# Patient Record
Sex: Male | Born: 1970 | Race: White | Hispanic: No | Marital: Married | Smoking: Never smoker
Health system: Southern US, Community
[De-identification: ages and names within clinical notes are randomized; demographics above are authoritative.]

## PROBLEM LIST (undated history)

## (undated) DIAGNOSIS — N529 Male erectile dysfunction, unspecified: Secondary | ICD-10-CM

## (undated) DIAGNOSIS — E785 Hyperlipidemia, unspecified: Secondary | ICD-10-CM

## (undated) DIAGNOSIS — Z8042 Family history of malignant neoplasm of prostate: Secondary | ICD-10-CM

## (undated) HISTORY — DX: Family history of malignant neoplasm of prostate: Z80.42

## (undated) HISTORY — DX: Hyperlipidemia, unspecified: E78.5

## (undated) HISTORY — DX: Male erectile dysfunction, unspecified: N52.9

---

## 2009-07-31 ENCOUNTER — Ambulatory Visit (HOSPITAL_BASED_OUTPATIENT_CLINIC_OR_DEPARTMENT_OTHER): Admission: RE | Admit: 2009-07-31 | Discharge: 2009-07-31 | Payer: Self-pay | Admitting: Ophthalmology

## 2011-03-28 LAB — POCT HEMOGLOBIN-HEMACUE: Hemoglobin: 16 g/dL (ref 13.0–17.0)

## 2011-05-05 NOTE — Op Note (Signed)
NAME:  Chris Garza, TEEPLE NO.:  [id number]   MEDICAL RECORD NO.:  [id number]          PATIENT TYPE:  AMB   LOCATION:  [facility]                         FACILITY:  [facility]   PHYSICIAN:  Tyrone Apple. 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   This procedure is indicated to restore single binocular vision and  restore alignment of visual axis.  The risks and benefits of the  procedure explained to the patient prior to procedure and informed  consent was obtained.   PROCEDURE:  Right inferior oblique myectomy and a right superior oblique  exploration.   DESCRIPTION OF TECHNIQUE:  The patient was taken into the operating room  and placed in the supine position.  The entire face was prepped and  draped in the usual sterile fashion.  After induction by general  anesthesia and establishment of laryngeal mask airway, my attention was  first directed to the right eye.  A lid speculum was placed.  The globe  was held in superior temporal quadrant.  The eye was depressed and  adducted.  An incision was made through the superior temporal fornix and  taken down to the posterior Tenons space and the right superior rectus  was then isolated on a Stevens hook and subsequently on a Green hook and  the superior oblique tendon was explored.  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  The tension in the superior oblique  tendon was found be adequate.  At this point attention was then directed  to the inferior oblique and the globe was then held in the inferior  temporal quadrant.  The eye was then elevated and abducted.  An incision  was made in the inferior temporal fornix and taken down to the posterior  subTenons space  and the right lateral rectus was then isolated on a  Stevens hooks  then subsequently a Green hook.  This was then used to  hold the globe in an elevated and abducted position and the inferior  oblique was then isolated under direct visualization coursing from its  origin in the anterior floor of the orbit to its insertion at the  posterior inferior temporal quadrant of the globe.  It was then  carefully sequestered on two Stevens hooks and it was then dissected  free from its overlying muscular fascia and intermuscular septum for a  distance of approximately 8 mm.  It was then placed on two Green hooks  and a 10 mm myectomy was then performed.  Hemostasis was achieved with  thermal cautery.  The instruments were removed.  The conjunctiva was  repositioned.  At the conclusion of the procedure TobraDex ointment was  instilled within the fornices of the right eye.  There were no apparent  complications.      Casimiro Needle A. Karleen Hampshire, M.D.  Electronically Signed     MAS/MEDQ  D:  07/31/2009  T:  07/31/2009  Job:  [id number]

## 2015-08-28 ENCOUNTER — Ambulatory Visit: Payer: Self-pay | Admitting: Family Medicine

## 2015-09-11 ENCOUNTER — Ambulatory Visit (INDEPENDENT_AMBULATORY_CARE_PROVIDER_SITE_OTHER): Payer: BLUE CROSS/BLUE SHIELD | Admitting: Family Medicine

## 2015-09-11 ENCOUNTER — Encounter: Payer: Self-pay | Admitting: Family Medicine

## 2015-09-11 VITALS — BP 147/94 | HR 86 | Ht 73.0 in | Wt 220.0 lb

## 2015-09-11 DIAGNOSIS — Z23 Encounter for immunization: Secondary | ICD-10-CM

## 2015-09-11 DIAGNOSIS — L819 Disorder of pigmentation, unspecified: Secondary | ICD-10-CM | POA: Insufficient documentation

## 2015-09-11 DIAGNOSIS — Z0000 Encounter for general adult medical examination without abnormal findings: Secondary | ICD-10-CM

## 2015-09-11 DIAGNOSIS — L570 Actinic keratosis: Secondary | ICD-10-CM

## 2015-09-11 DIAGNOSIS — N529 Male erectile dysfunction, unspecified: Secondary | ICD-10-CM | POA: Diagnosis not present

## 2015-09-11 DIAGNOSIS — Z202 Contact with and (suspected) exposure to infections with a predominantly sexual mode of transmission: Secondary | ICD-10-CM

## 2015-09-11 DIAGNOSIS — Z8042 Family history of malignant neoplasm of prostate: Secondary | ICD-10-CM | POA: Diagnosis not present

## 2015-09-11 HISTORY — DX: Family history of malignant neoplasm of prostate: Z80.42

## 2015-09-11 HISTORY — DX: Male erectile dysfunction, unspecified: N52.9

## 2015-09-11 MED ORDER — SILDENAFIL CITRATE 100 MG PO TABS: 50.0000 mg | ORAL_TABLET | Freq: Every day | ORAL | Status: DC | PRN
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
## 2015-09-12 ENCOUNTER — Telehealth: Payer: Self-pay | Admitting: *Deleted

## 2015-09-12 LAB — HEPATITIS C ANTIBODY: HCV Ab: NEGATIVE

## 2015-09-12 LAB — HSV 2 ANTIBODY, IGG

## 2015-09-12 LAB — RPR

## 2015-09-12 LAB — HIV ANTIBODY (ROUTINE TESTING W REFLEX): HIV: NONREACTIVE

## 2015-09-12 LAB — GC/CHLAMYDIA PROBE AMP, URINE
CHLAMYDIA, SWAB/URINE, PCR: NEGATIVE
GC PROBE AMP, URINE: NEGATIVE

## 2015-09-12 LAB — HSV 1 ANTIBODY, IGG: HSV 1 GLYCOPROTEIN G AB, IGG: 9.22 IV — AB

## 2015-09-12 MED ORDER — SILDENAFIL CITRATE 100 MG PO TABS: 50.0000 mg | ORAL_TABLET | Freq: Every day | ORAL | Status: DC | PRN

## 2015-09-12 NOTE — Telephone Encounter (Signed)
Pt called and states the Viagra was too expensive

## 2015-09-12 NOTE — Telephone Encounter (Signed)
Pt called back; wants generic viagra sent to [facility]

## 2015-09-13 ENCOUNTER — Telehealth: Payer: Self-pay | Admitting: Family Medicine

## 2015-09-13 DIAGNOSIS — B009 Herpesviral infection, unspecified: Secondary | ICD-10-CM | POA: Insufficient documentation

## 2015-09-13 DIAGNOSIS — Z1159 Encounter for screening for other viral diseases: Principal | ICD-10-CM

## 2015-09-13 MED ORDER — VALACYCLOVIR HCL 1 G PO TABS: ORAL_TABLET | ORAL | Status: DC

## 2015-09-13 MED ORDER — SILDENAFIL CITRATE 20 MG PO TABS: ORAL_TABLET | ORAL | Status: DC

## 2015-09-13 NOTE — Telephone Encounter (Signed)
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  There is no way to know when it happened but sometime in his life he was exposed to this virus and the virus is now hiding in his body.  The only way to transmit this virus to others would be direct contact with another body when he has an active ulcer.  I've put a Rx for valtrex in your in box, he can use this to treat an outbreak if he ever develops an ulcer on his lips or genitals.

## 2015-09-13 NOTE — Telephone Encounter (Signed)
Left message for pt to call back  °

## 2015-09-13 NOTE — Telephone Encounter (Signed)
Pt.notified

## 2015-09-13 NOTE — Telephone Encounter (Signed)
Chris Garza, Generic viagra in your in box.  I'd recommend he submit this to [facility] in [place] to avoid being blocked by his insurance.

## 2015-09-13 NOTE — Telephone Encounter (Signed)
Sent to [facility]

## 2015-09-13 NOTE — Addendum Note (Signed)
Addended by: Laren Boom on: 09/13/2015 08:02 AM   Modules accepted: Orders, Medications

## 2015-09-18 ENCOUNTER — Encounter: Payer: Self-pay | Admitting: Family Medicine

## 2015-09-18 ENCOUNTER — Telehealth: Payer: Self-pay | Admitting: Family Medicine

## 2015-09-18 DIAGNOSIS — E785 Hyperlipidemia, unspecified: Secondary | ICD-10-CM | POA: Insufficient documentation

## 2015-09-18 LAB — CBC
HCT: 43.9 % (ref 39.0–52.0)
HEMOGLOBIN: 15.5 g/dL (ref 13.0–17.0)
MCH: 31.1 pg (ref 26.0–34.0)
MCHC: 35.3 g/dL (ref 30.0–36.0)
MCV: 88 fL (ref 78.0–100.0)
MPV: 10.9 fL (ref 8.6–12.4)
Platelets: 277 10*3/uL (ref 150–400)
RBC: 4.99 MIL/uL (ref 4.22–5.81)
RDW: 14 % (ref 11.5–15.5)
WBC: 6 10*3/uL (ref 4.0–10.5)

## 2015-09-18 LAB — LIPID PANEL
CHOLESTEROL: 214 mg/dL — AB (ref 125–200)
HDL: 36 mg/dL — ABNORMAL LOW (ref >=40)
LDL Cholesterol: 146 mg/dL — ABNORMAL HIGH (ref <130)
TRIGLYCERIDES: 162 mg/dL — AB (ref <150)
Total CHOL/HDL Ratio: 5.9 Ratio — ABNORMAL HIGH (ref <=5.0)
VLDL: 32 mg/dL — AB (ref <30)

## 2015-09-18 LAB — COMPLETE METABOLIC PANEL WITH GFR
ALBUMIN: 4 g/dL (ref 3.6–5.1)
ALK PHOS: 81 U/L (ref 40–115)
ALT: 27 U/L (ref 9–46)
AST: 23 U/L (ref 10–40)
BILIRUBIN TOTAL: 0.9 mg/dL (ref 0.2–1.2)
BUN: 5 mg/dL — AB (ref 7–25)
CALCIUM: 8.8 mg/dL (ref 8.6–10.3)
CO2: 29 mmol/L (ref 20–31)
CREATININE: 1 mg/dL (ref 0.60–1.35)
Chloride: 105 mmol/L (ref 98–110)
GFR, Est African American: >89 mL/min (ref >=60)
GFR, Est Non African American: >89 mL/min (ref >=60)
GLUCOSE: 88 mg/dL (ref 65–99)
Potassium: 3.7 mmol/L (ref 3.5–5.3)
SODIUM: 142 mmol/L (ref 135–146)
TOTAL PROTEIN: 6.6 g/dL (ref 6.1–8.1)

## 2015-09-18 LAB — PSA: PSA: 1.95 ng/mL (ref <=4.00)

## 2015-09-18 MED ORDER — FISH OIL 1000 MG PO CAPS: ORAL_CAPSULE | ORAL | Status: DC

## 2015-09-18 NOTE — Telephone Encounter (Signed)
Sue Lush, Will you please let patient know that his LDL cholesterol was moderately elevated however given his age, blood pressure, and the fact that he does not have diabetes no prescription strength medication is warranted.  I would recommend starting a twice a day OTC fish oil capsule with meals to help this and his elevated triglyceride level.  Kidney function, liver function, blood sugar, blood cell counts and the PSA prostate test were all normal.  I'd recommend he have these rechecked annually.

## 2015-09-18 NOTE — Telephone Encounter (Signed)
Pt.notified

## 2015-10-01 ENCOUNTER — Encounter: Payer: Self-pay | Admitting: Family Medicine

## 2015-10-01 ENCOUNTER — Ambulatory Visit (INDEPENDENT_AMBULATORY_CARE_PROVIDER_SITE_OTHER): Payer: BLUE CROSS/BLUE SHIELD | Admitting: Family Medicine

## 2015-10-01 ENCOUNTER — Ambulatory Visit (INDEPENDENT_AMBULATORY_CARE_PROVIDER_SITE_OTHER): Payer: BLUE CROSS/BLUE SHIELD

## 2015-10-01 VITALS — BP 136/89 | HR 112 | Temp 99.8°F | Wt 215.0 lb

## 2015-10-01 DIAGNOSIS — R69 Illness, unspecified: Secondary | ICD-10-CM

## 2015-10-01 DIAGNOSIS — J111 Influenza due to unidentified influenza virus with other respiratory manifestations: Secondary | ICD-10-CM | POA: Diagnosis not present

## 2015-10-01 DIAGNOSIS — J189 Pneumonia, unspecified organism: Secondary | ICD-10-CM | POA: Diagnosis not present

## 2015-10-01 MED ORDER — AZITHROMYCIN 250 MG PO TABS: 250.0000 mg | ORAL_TABLET | Freq: Every day | ORAL | Status: DC

## 2015-10-01 MED ORDER — CEFDINIR 300 MG PO CAPS: 300.0000 mg | ORAL_CAPSULE | Freq: Two times a day (BID) | ORAL | Status: DC

## 2015-10-01 NOTE — Patient Instructions (Signed)
Thank you for coming in today. Return if worse or not better.  Get a xray today.  Take up to 2 aleve twice daily for pain and fever.  Call or go to the emergency room if you get worse, have trouble breathing, have chest pains, or palpitations.    Influenza, Adult Influenza ("the flu") is a viral infection of the respiratory tract. It occurs more often in winter months because people spend more time in close contact with one another. Influenza can make you feel very sick. Influenza easily spreads from person to person (contagious). CAUSES  Influenza is caused by a virus that infects the respiratory tract. You can catch the virus by breathing in droplets from an infected person's cough or sneeze. You can also catch the virus by touching something that was recently contaminated with the virus and then touching your mouth, nose, or eyes. RISKS AND COMPLICATIONS You may be at risk for a more severe case of influenza if you smoke cigarettes, have diabetes, have chronic heart disease (such as heart failure) or lung disease (such as asthma), or if you have a weakened immune system. Elderly people and pregnant women are also at risk for more serious infections. The most common problem of influenza is a lung infection (pneumonia). Sometimes, this problem can require emergency medical care and may be life threatening. SIGNS AND SYMPTOMS  Symptoms typically last 4 to 10 days and may include:  Fever.  Chills.  Headache, body aches, and muscle aches.  Sore throat.  Chest discomfort and cough.  Poor appetite.  Weakness or feeling tired.  Dizziness.  Nausea or vomiting. DIAGNOSIS  Diagnosis of influenza is often made based on your history and a physical exam. A nose or throat swab test can be done to confirm the diagnosis. TREATMENT  In mild cases, influenza goes away on its own. Treatment is directed at relieving symptoms. For more severe cases, your health care provider may prescribe antiviral  medicines to shorten the sickness. Antibiotic medicines are not effective because the infection is caused by a virus, not by bacteria. HOME CARE INSTRUCTIONS  Take medicines only as directed by your health care provider.  Use a cool mist humidifier to make breathing easier.  Get plenty of rest until your temperature returns to normal. This usually takes 3 to 4 days.  Drink enough fluid to keep your urine clear or pale yellow.  Cover yourmouth and nosewhen coughing or sneezing,and wash your handswellto prevent thevirusfrom spreading.  Stay homefromwork orschool untilthe fever is gonefor at least 67full day. PREVENTION  An annual influenza vaccination (flu shot) is the best way to avoid getting influenza. An annual flu shot is now routinely recommended for all adults in the [place] SEEK MEDICAL CARE IF:  You experiencechest pain, yourcough worsens,or you producemore mucus.  Youhave nausea,vomiting, ordiarrhea.  Your fever returns or gets worse. SEEK IMMEDIATE MEDICAL CARE IF:  You havetrouble breathing, you become short of breath,or your skin ornails becomebluish.  You have severe painor stiffnessin the neck.  You develop a sudden headache, or pain in the face or ear.  You have nausea or vomiting that you cannot control. MAKE SURE YOU:   Understand these instructions.  Will watch your condition.  Will get help right away if you are not doing well or get worse.   This information is not intended to replace advice given to you by your health care provider. Make sure you discuss any questions you have with your health care provider.  Document Released: 12/04/2000 Document Revised: 12/28/2014 Document Reviewed: 03/07/2012 Elsevier Interactive Patient Education [employer].

## 2015-10-01 NOTE — Progress Notes (Signed)
Quick Note:  Xray shows pneumonia. I called in two antibiotics and left a message for the patient. Can you please confirm that he is aware of them. ______

## 2015-10-01 NOTE — Progress Notes (Signed)
Chris Garza is a 44 y.o. male who presents to [facility] Chris Garza: Primary [facility]  today for low grade fever, nausea and body aches. Chris Garza states that his symptoms started on Saturday and have been getting a little worse each day. He states that he knows he has a fever because his kidneys hurt as well as his body. He has tried tylenol OTC which has helped. He has already received a flu shot this year. Additionally, he states that he has recently developed a non-productive cough and urinary frequency, which he attributes to increased fluid intake. He denies recent illness, rashes, headache, eye pain, ear pain/pressure, sinus congestion, chest pain, shortness of breath, abdominal pain, bowel changes, dysuria or hematuria.    Past Medical History  Diagnosis Date  . Erectile dysfunction 09/11/2015  . Family history of prostate cancer - Father 09/11/2015   No past surgical history on file. Social History  Substance Use Topics  . Smoking status: Never Smoker   . Smokeless tobacco: Current User    Types: Chew  . Alcohol Use: 0.6 oz/week    1 Standard drinks or equivalent per week   family history includes Depression in an other family member; Heart attack in his father; Hyperlipidemia in his father; Stroke in his father.  ROS as above Medications: Current Outpatient Prescriptions  Medication Sig Dispense Refill  . Omega-3 Fatty Acids (FISH OIL) 1000 MG CAPS One by mouth PO BID  0  . sildenafil (REVATIO) 20 MG tablet 2-4 tabs by mouth 30 minutes prior to sex only as needed for ED.  No more than 4 a day. 50 tablet 2  . valACYclovir (VALTREX) 1000 MG tablet One by mouth every 12 hours for seven days.  Start ASAP within three days of ulcer outbreak. 14 tablet 3   No current facility-administered medications for this visit.   No Known Allergies   Exam:  BP 136/89 mmHg  Pulse 112  Temp(Src) 99.8 F (37.7 C) (Oral)  Wt 215 lb (97.523 kg) Gen: WDWN male in no acute  distress. Non-toxic appearing HEENT: EOMI,  MMM. TMs and bony ossicles visualized bilaterally w/o bulging or erythema. Maxillary and frontal sinuses nontender to palpation. Oropharynx w/o erythema, exudate or cobblestoning. No lymphadenopathy on palpation.  Lungs: Normal work of breathing. Mild crackles noted at base of left lung.  Heart: Regular rhythm mild tachycardia present. no MRG Abd: NABS, Soft. Nondistended, Nontender. Negative for CVA tenderness. Exts: Brisk capillary refill, warm and well perfused.   CLINICAL DATA: Cough fever and abnormal chest exam on the left, symptoms for 1 week, nonsmoker.  EXAM: CHEST 2 VIEW  COMPARISON: None in PACs  FINDINGS: There are patchy increased airspace opacities in the left lower lobe. The right lung is clear. The heart and pulmonary vascularity are normal. The mediastinum is normal in width. There is no pleural effusion. The bony thorax exhibits no acute abnormality.  IMPRESSION: Left lower lobe pneumonia. Followup PA and lateral chest X-ray is recommended in 3-4 weeks following trial of antibiotic therapy to ensure resolution and exclude underlying malignancy.   Electronically Signed  By: Chris Garza M.D.  On: 10/01/2015 10:40       Assessment/Plan: 44 year old male with pneumonia. Patient was discharged before the x-ray was finished. He initially was thought to have influenza-like illness and was treated symptomatically. However it is clear he has community-acquired pneumonia. Plan to treat with azithromycin and Omnicef. Return in a few days if not better. I've called and left a voicemail  messagewith the patient

## 2016-04-21 ENCOUNTER — Ambulatory Visit (INDEPENDENT_AMBULATORY_CARE_PROVIDER_SITE_OTHER): Payer: BLUE CROSS/BLUE SHIELD | Admitting: Family Medicine

## 2016-04-21 ENCOUNTER — Encounter: Payer: Self-pay | Admitting: Family Medicine

## 2016-04-21 VITALS — BP 113/76 | HR 84 | Wt 221.0 lb

## 2016-04-21 DIAGNOSIS — R413 Other amnesia: Secondary | ICD-10-CM | POA: Diagnosis not present

## 2016-04-21 DIAGNOSIS — L989 Disorder of the skin and subcutaneous tissue, unspecified: Secondary | ICD-10-CM

## 2016-04-21 MED ORDER — BUPROPION HCL ER (XL) 150 MG PO TB24: 150.0000 mg | ORAL_TABLET | Freq: Every day | ORAL | Status: DC

## 2016-04-21 NOTE — Progress Notes (Signed)
CC: Chris MatesJason Garza is a 45 y.o. male is here for trouble retaining information; Depression; and Speech Problem   Subjective: HPI:  For the past 3 months he's noticed that he's been more forgetful about day-to-day activities. For example home noted that he has to do something immediately when he gets home however he can't remember what it is he needs to do once he arrives at home. If the family member reminds him about it he immediately remembers that that was the sure he was supposed to address. 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 Symptoms are mild in severity nothing seems to make them better or worse. He denies misplacing objects or getting lost in familiar situations. He also reports subjective depression but only a mild degree, he further describes this as just feeling down and hasn't been happy about much lately. He denies any thoughts of harm self or others. He is already ordered some St. John's wort and hopes that this helps with the above symptoms.  He also has a spot on his back and the front of his abdomen that keep getting irritated whenever he wears a shirt. They've been there for years now and slowly enlarging. They're mildly painful  Review Of Systems Outlined In HPI  Past Medical History  Diagnosis Date  . Erectile dysfunction 09/11/2015  . Family history of prostate cancer - Father 09/11/2015    No past surgical history on file. Family History  Problem Relation Age of Onset  . Heart attack Father   . Depression      uncle  . Hyperlipidemia Father   . Stroke Father     Social History   Social History  . Marital Status: Married    Spouse Name: N/A  . Number of Children: N/A  . Years of Education: N/A   Occupational History  . Not on file.   Social History Main Topics  . Smoking status: Never Smoker   . Smokeless tobacco: Current User    Types: Chew  . Alcohol Use: 0.6  oz/week    1 Standard drinks or equivalent per week  . Drug Use: No  . Sexual Activity:    Partners: Female   Other Topics Concern  . Not on file   Social History Narrative     Objective: BP 113/76 mmHg  Pulse 84  Wt 221 lb (100.245 kg)  Vital signs reviewed. General: Alert and Oriented, No Acute Distress HEENT: Pupils equal, round, reactive to light. Conjunctivae clear.  External ears unremarkable.  Moist mucous membranes. Lungs: Clear and comfortable work of breathing, speaking in full sentences without accessory muscle use. Cardiac: Regular rate and rhythm.  Neuro: CN II-XII grossly intact, gait normal. Extremities: No peripheral edema.  Strong peripheral pulses.  Mental Status: No depression, anxiety, nor agitation. Logical though process. Skin: Warm and dry. Fleshy skin growth on the abdomen and on the back, no pigmentation. 4 mm in diameter and raised  Assessment & Plan: Chris Garza was seen today for trouble retaining information, depression and speech problem.  Diagnoses and all orders for this visit:  Memory problem  Skin lesion  Other orders -     buPROPion (WELLBUTRIN XL) 150 MG 24 hr tablet; Take 1 tablet (150 mg total) by mouth daily.   Memory problem: Low suspicion of dementia, possibility of mild depression. Start St. John's wort that he's already paid for. If no better after 2 weeks begin taking Wellbutrin  and stopped St. John's wort. Skin lesion: Offered cryotherapy today he would like to go through with this. Cryotherapy Procedure Note  Pre-operative Diagnosis: Suspicious lesion  Post-operative Diagnosis: Suspicious lesion  Locations: abdomen and back  Indications: pain  Anesthesia: none  Procedure Details  History of allergy to iodine: no. Pacemaker? no.  Patient informed of risks (permanent scarring, infection, light or dark discoloration, bleeding, infection, weakness, numbness and recurrence of the lesion) and benefits of the procedure and  verbal informed consent obtained.  The areas are treated with liquid nitrogen therapy, frozen until ice ball extended 2 mm beyond lesion, allowed to thaw, and treated again. The patient tolerated procedure well.  The patient was instructed on post-op care, warned that there may be blister formation, redness and pain. Recommend OTC analgesia as needed for pain.  Condition: Stable  Complications: none.  Plan: 1. Instructed to keep the area dry and covered for 24-48h and clean thereafter. 2. Warning signs of infection were reviewed.   3. Recommended that the patient use OTC analgesics as needed for pain.  4. Return PRN    Return in about 4 weeks (around 05/19/2016) for Mood.

## 2016-06-08 ENCOUNTER — Other Ambulatory Visit: Payer: Self-pay

## 2016-06-08 MED ORDER — BUPROPION HCL ER (XL) 150 MG PO TB24: 150.0000 mg | ORAL_TABLET | Freq: Every day | ORAL | Status: DC

## 2016-12-23 IMAGING — CR DG CHEST 2V
2 series · 2 of 2 positions shown · non-contrast
Comparison: None in PACs

CLINICAL DATA: Cough fever and abnormal chest exam on the left,
symptoms for 1 week, nonsmoker.

EXAM:
CHEST  2 VIEW

[chest pa]
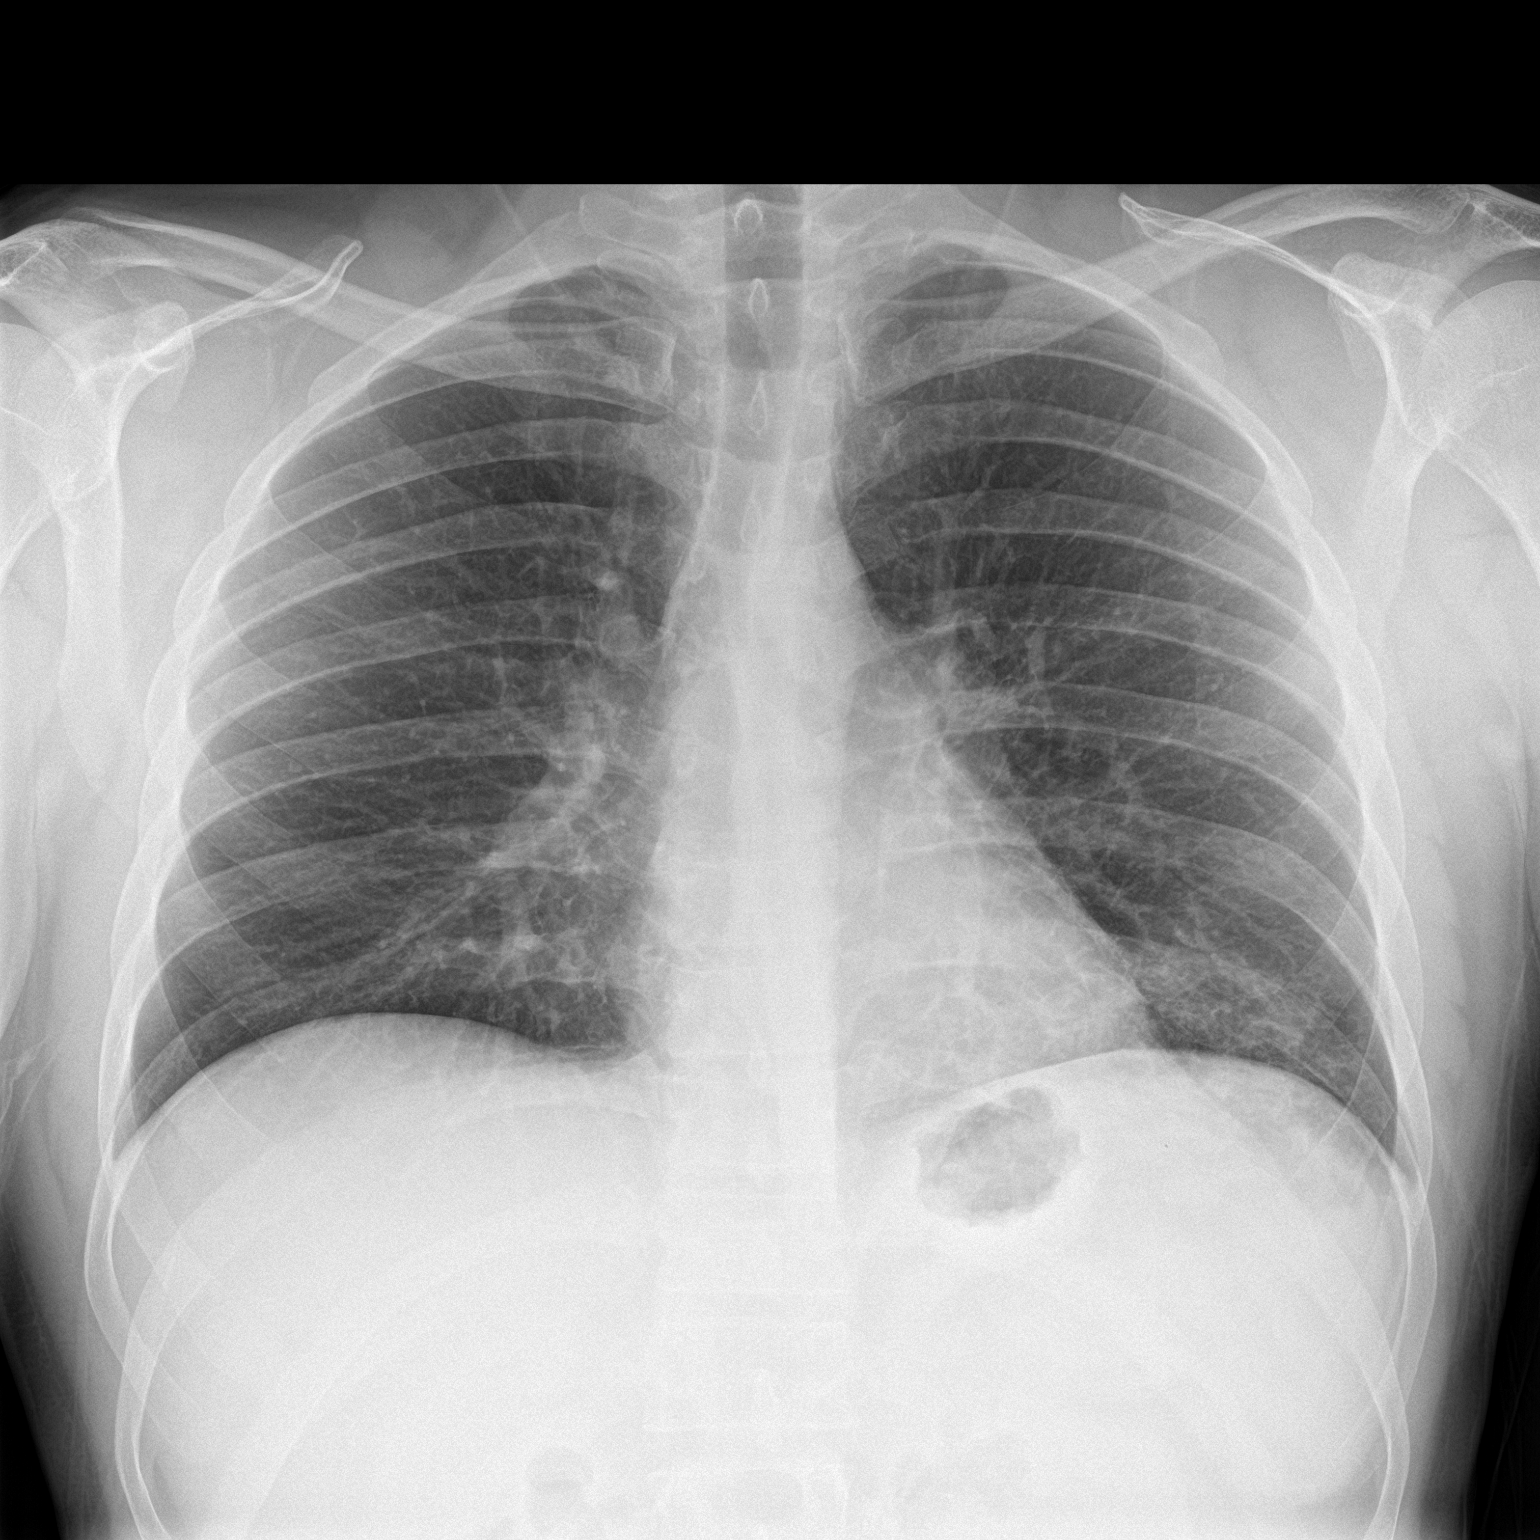

[chest lat]
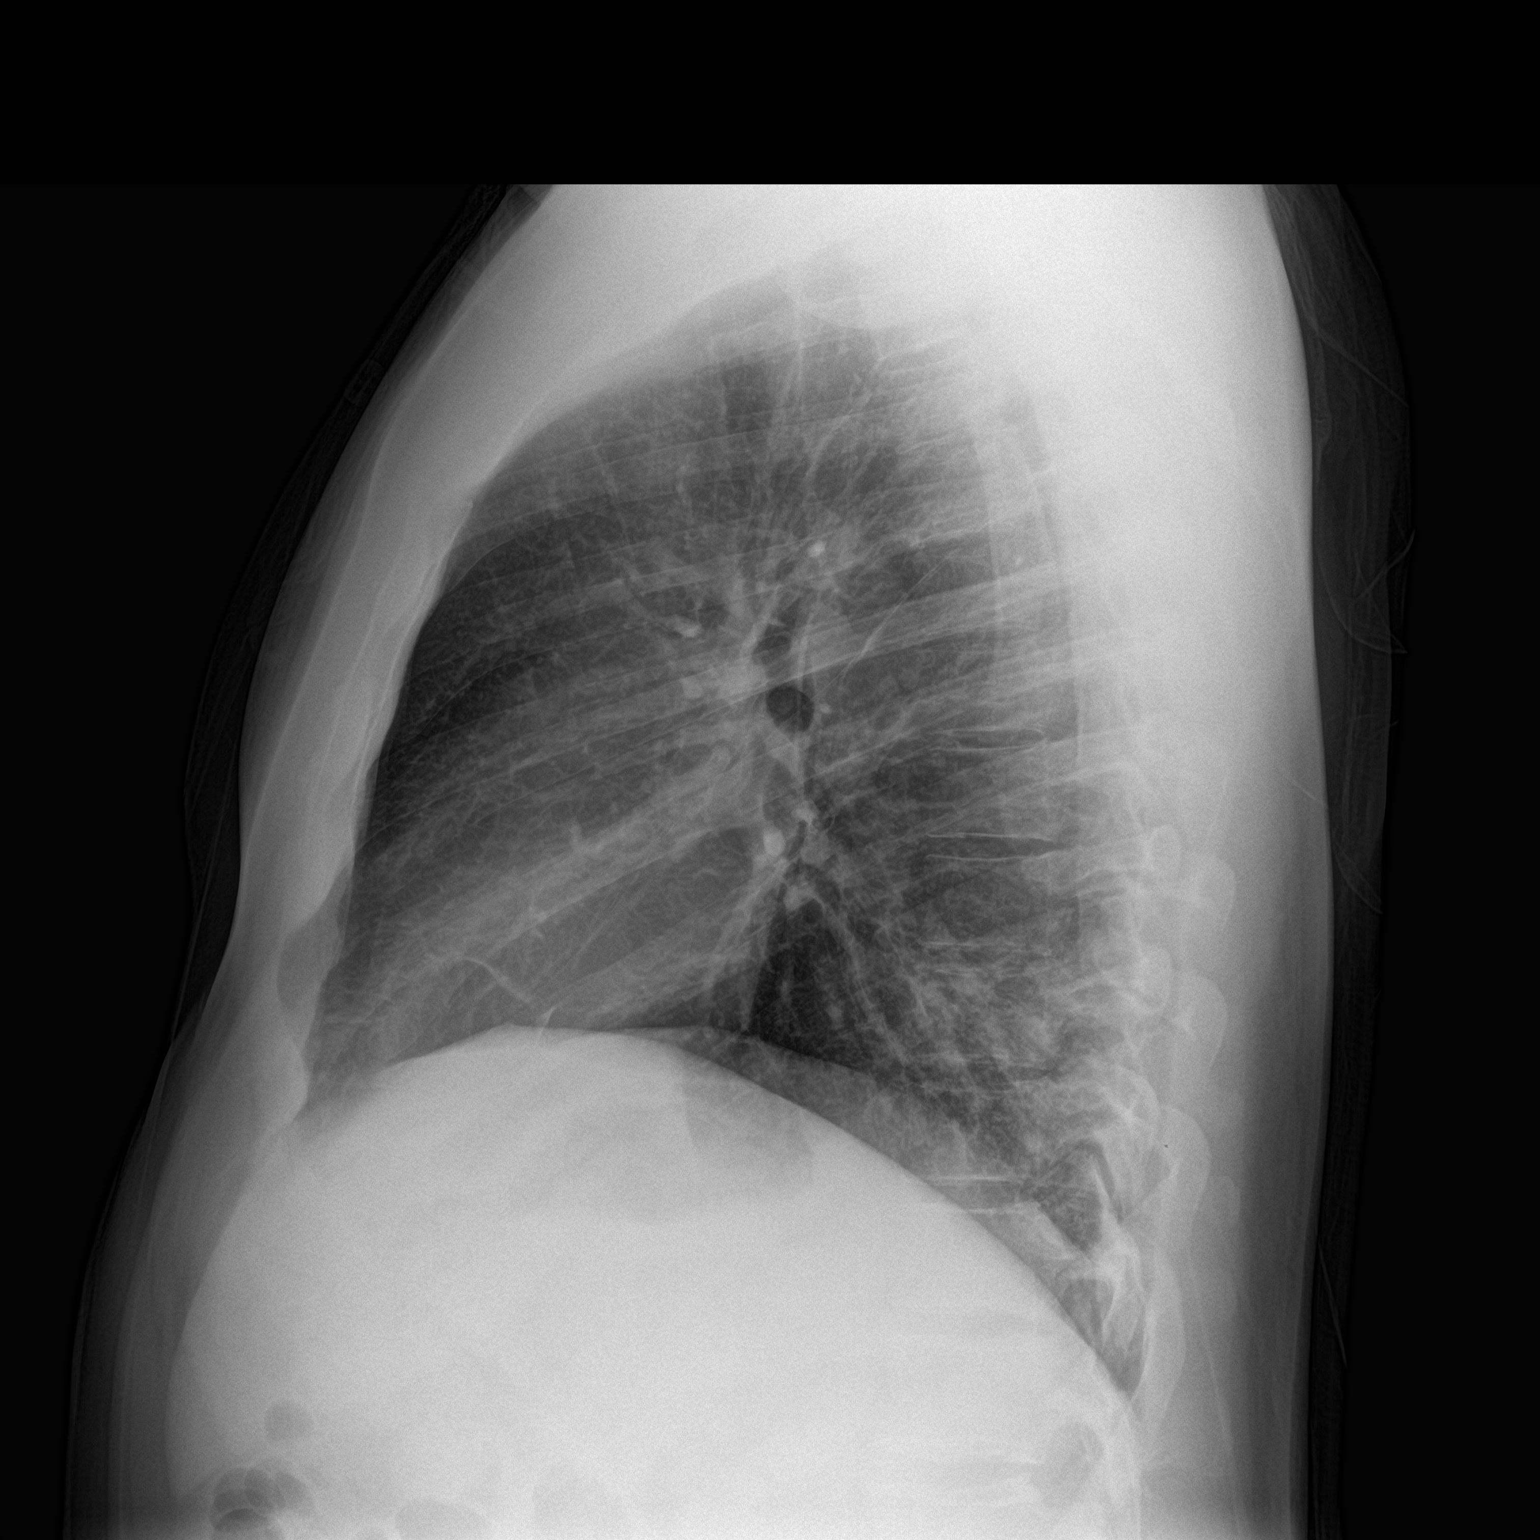

[2 of 2 positions shown; findings below may reference images not displayed]

FINDINGS: There are patchy increased airspace opacities in the left lower
lobe. The right lung is clear. The heart and pulmonary vascularity
are normal. The mediastinum is normal in width. There is no pleural
effusion. The bony thorax exhibits no acute abnormality.
IMPRESSION: Left lower lobe pneumonia. Followup PA and lateral chest X-ray is
recommended in 3-4 weeks following trial of antibiotic therapy to
ensure resolution and exclude underlying malignancy.

## 2017-08-12 ENCOUNTER — Ambulatory Visit: Payer: BLUE CROSS/BLUE SHIELD | Admitting: Physician Assistant

## 2017-10-26 ENCOUNTER — Encounter: Payer: Self-pay | Admitting: Family Medicine

## 2017-10-26 ENCOUNTER — Ambulatory Visit (INDEPENDENT_AMBULATORY_CARE_PROVIDER_SITE_OTHER): Payer: BLUE CROSS/BLUE SHIELD | Admitting: Family Medicine

## 2017-10-26 VITALS — BP 133/79 | HR 86 | Ht 74.0 in | Wt 226.0 lb

## 2017-10-26 DIAGNOSIS — Z8042 Family history of malignant neoplasm of prostate: Secondary | ICD-10-CM

## 2017-10-26 DIAGNOSIS — Z720 Tobacco use: Secondary | ICD-10-CM | POA: Insufficient documentation

## 2017-10-26 DIAGNOSIS — N400 Enlarged prostate without lower urinary tract symptoms: Secondary | ICD-10-CM | POA: Insufficient documentation

## 2017-10-26 DIAGNOSIS — R350 Frequency of micturition: Secondary | ICD-10-CM

## 2017-10-26 DIAGNOSIS — N529 Male erectile dysfunction, unspecified: Secondary | ICD-10-CM | POA: Diagnosis not present

## 2017-10-26 DIAGNOSIS — E782 Mixed hyperlipidemia: Secondary | ICD-10-CM

## 2017-10-26 DIAGNOSIS — N401 Enlarged prostate with lower urinary tract symptoms: Secondary | ICD-10-CM

## 2017-10-26 DIAGNOSIS — Z23 Encounter for immunization: Secondary | ICD-10-CM | POA: Diagnosis not present

## 2017-10-26 MED ORDER — VALACYCLOVIR HCL 1 G PO TABS: ORAL_TABLET | ORAL | 3 refills | Status: DC

## 2017-10-26 MED ORDER — TAMSULOSIN HCL 0.4 MG PO CAPS: 0.4000 mg | ORAL_CAPSULE | Freq: Every day | ORAL | 1 refills | Status: DC

## 2017-10-26 NOTE — Patient Instructions (Addendum)
Thank you for coming in today. Get labs in the near future.  Recheck yearly or sooner if needed.  We may be starting testosterone replacement in the future.  Start flomax daily for BPH symptoms.   Tamsulosin capsules What is this medicine? TAMSULOSIN (tam SOO loe sin) is used to treat enlargement of the prostate gland in men, a condition called benign prostatic hyperplasia or BPH. It is not for use in women. It works by relaxing muscles in the prostate and bladder neck. This improves urine flow and reduces BPH symptoms. This medicine may be used for other purposes; ask your health care provider or pharmacist if you have questions. COMMON BRAND NAME(S): Flomax What should I tell my health care provider before I take this medicine? They need to know if you have any of the following conditions: -advanced kidney disease -advanced liver disease -low blood pressure -prostate cancer -an unusual or allergic reaction to tamsulosin, sulfa drugs, other medicines, foods, dyes, or preservatives -pregnant or trying to get pregnant -breast-feeding How should I use this medicine? Take this medicine by mouth about 30 minutes after the same meal every day. Follow the directions on the prescription label. Swallow the capsules whole with a glass of water. Do not crush, chew, or open capsules. Do not take your medicine more often than directed. Do not stop taking your medicine unless your doctor tells you to. Talk to your pediatrician regarding the use of this medicine in children. Special care may be needed. Overdosage: If you think you have taken too much of this medicine contact a poison control center or emergency room at once. NOTE: This medicine is only for you. Do not share this medicine with others. What if I miss a dose? If you miss a dose, take it as soon as you can. If it is almost time for your next dose, take only that dose. Do not take double or extra doses. If you stop taking your medicine for  several days or more, ask your doctor or health care professional what dose you should start back on. What may interact with this medicine? -cimetidine -fluoxetine -ketoconazole -medicines for erectile disfunction like sildenafil, tadalafil, vardenafil -medicines for high blood pressure -other alpha-blockers like alfuzosin, doxazosin, phentolamine, phenoxybenzamine, prazosin, terazosin -warfarin This list may not describe all possible interactions. Give your health care provider a list of all the medicines, herbs, non-prescription drugs, or dietary supplements you use. Also tell them if you smoke, drink alcohol, or use illegal drugs. Some items may interact with your medicine. What should I watch for while using this medicine? Visit your doctor or health care professional for regular check ups. You will need lab work done before you start this medicine and regularly while you are taking it. Check your blood pressure as directed. Ask your health care professional what your blood pressure should be, and when you should contact him or her. This medicine may make you feel dizzy or lightheaded. This is more likely to happen after the first dose, after an increase in dose, or during hot weather or exercise. Drinking alcohol and taking some medicines can make this worse. Do not drive, use machinery, or do anything that needs mental alertness until you know how this medicine affects you. Do not sit or stand up quickly. If you begin to feel dizzy, sit down until you feel better. These effects can decrease once your body adjusts to the medicine. Contact your doctor or health care professional right away if you have an  erection that lasts longer than 4 hours or if it becomes painful. This may be a sign of a serious problem and must be treated right away to prevent permanent damage. If you are thinking of having cataract surgery, tell your eye surgeon that you have taken this medicine. What side effects may I  notice from receiving this medicine? Side effects that you should report to your doctor or health care professional as soon as possible: -allergic reactions like skin rash or itching, hives, swelling of the lips, mouth, tongue, or throat -breathing problems -change in vision -feeling faint or lightheaded -irregular heartbeat -prolonged or painful erection -weakness Side effects that usually do not require medical attention (report to your doctor or health care professional if they continue or are bothersome): -back pain -change in sex drive or performance -constipation, nausea or vomiting -cough -drowsy -runny or stuffy nose -trouble sleeping This list may not describe all possible side effects. Call your doctor for medical advice about side effects. You may report side effects to FDA at 1-800-FDA-1088. Where should I keep my medicine? Keep out of the reach of children. Store at room temperature between 15 and 30 degrees C (59 and 86 degrees F). Throw away any unused medicine after the expiration date. NOTE: This sheet is a summary. It may not cover all possible information. If you have questions about this medicine, talk to your doctor, pharmacist, or health care provider.  2018 Elsevier/Gold Standard (2012-12-07 14:11:34)   Testosterone Why am I having this test? Testosterone is a hormone made by the male's testicles and by the adrenal glands, which are a pair of glands on top of the kidneys. Starting at puberty, testosterone stimulates the development of secondary sex characteristics. This includes a deeper voice, growth of muscles and body hair, and penis enlargement. Females also produce testosterone in both the adrenal glands and ovaries. A male's body converts testosterone into estradiol, the main male sex hormone. An abnormal level of testosterone can cause health issues in both males and females. You may have this test if your health care provider suspects that an abnormal  testosterone level is causing or contributing to other health problems. In males, symptoms of an abnormal testosterone level include:  Infertility.  Erectile dysfunction.  Delayed puberty or premature puberty.  In females, symptoms of an abnormally high testosterone level include:  Infertility.  Polycystic ovarian syndrome (PCOS).  Developing masculine features (virilization).  What kind of sample is taken? This test requires a blood sample taken from a vein in your arm or hand. The sample for this test is usually collected in the morning. The amount of testosterone in your blood is highest at that time. What do the results mean? It is your responsibility to obtain your test results. Ask the lab or department performing the test when and how you will get your results. Contact your health care provider to discuss any questions you have about your results. The result of a blood test for testosterone will be given as a range of values. A testosterone level that is outside the normal range may indicate a health problem. Testosterone is measured in nanograms per deciliter (ng/dL). Range of normal values Ranges for normal values may vary among different labs and hospitals. You should always check with your health care provider after having lab work or other tests done to discuss whether your values are considered within normal limits. Normal levels of total testosterone are as follows:  Male: ? 7 months to 45 years  old: less than 30 ng/dL. ? 39-68 years old: less than 300 ng/dL. ? 42-30 years old: 170-540 ng/dL. ? 60-81 years old: 250-910 ng/dL. ? 45 years old and over: 280-1,080 ng/dL.  Male: ? 7 months to 45 years old: less than 30 ng/dL. ? 52-71 years old: less than 40 ng/dL. ? 38-89 years old: less than 60 ng/dL. ? 65-48 years old: less than 70 ng/dL. ? 45 years old and over: less than 70 ng/dL.  Meaning of results outside normal value ranges A testosterone level that is too  low or too high can indicate a number of health problems. In males:  A high testosterone level can occur if you: ? Have certain types of tumors. ? Have an overactive thyroid gland (hyperthyroidism). ? Use anabolic steroids. ? Are starting puberty early (precocious puberty). ? Have an inherited disorder that affects the adrenal glands (congenital adrenal hyperplasia).  A low testosterone level can occur if you: ? Have certain genetic diseases. ? Have had certain viral infections, such as mumps. ? Have pituitary disease. ? Have had an injury to the testicles. ? Are an alcoholic.  In females:  A high testosterone level can occur if you have: ? Certain types of tumors. ? An inherited disorder that affects certain cells in the adrenal glands (congenital adrenocortical hyperplasia). ? PCOS.  A low testosterone level does not cause health problems.  Discuss the results of your testosterone test with your health care provider. Your health care provider will use the results of this test and other tests to make a diagnosis. Talk with your health care provider to discuss your results, treatment options, and if necessary, the need for more tests. Talk with your health care provider if you have any questions about your results. This information is not intended to replace advice given to you by your health care provider. Make sure you discuss any questions you have with your health care provider. Document Released: 12/24/2004 Document Revised: 08/08/2016 Document Reviewed: 04/04/2014 Elsevier Interactive Patient Education  [employer].

## 2017-10-26 NOTE — Progress Notes (Signed)
Chris MatesJason Ruffalo is a 46 y.o. male who [facility] today for establish care.    Barbara CowerJason several medical issues he would like to discuss today.  The most prominent one is lack of libido.  He is noted this previously but has never had his testosterone tested.  He is interested in checking on his testosterone if possible.  He would be interested in supplementation if found deficient.  Additionally he notes a family history for prostate cancer in his father who developed it is mid 7050s.  Additionally he notes some symptoms suggestive of prostate enlargement such as urinary frequency stopping and starting and weak stream.  He gets up about 1 time at night to urinate and finds his symptoms to be mildly obnoxious.  He has never had an evaluation with urologist but had a normal PSA test about 2 years ago.  AUASS 14/35 QOL form 2/6  He notes a history of erectile dysfunction.  He is taken sildenafil intermittently but is doing fine on his own right now without any medication.   Lastly he notes that he uses oral tobacco.  He uses about 1 tin of dip daily.  He is interested in quitting and would like to try nicotine replacement therapy.  Past Medical History:  Diagnosis Date  . Erectile dysfunction 09/11/2015  . Family history of prostate cancer - Father 09/11/2015   No past surgical history on file. Social History   Tobacco Use  . Smoking status: Never Smoker  . Smokeless tobacco: Current User    Types: Chew  Substance Use Topics  . Alcohol use: Yes    Alcohol/week: 0.6 oz    Types: 1 Standard drinks or equivalent per week   family history includes Cancer in his father; Depression in his unknown relative; Heart attack in his father; Hyperlipidemia in his father; Stroke in his father.  ROS as above: No headache, visual changes, nausea, vomiting, diarrhea,  constipation, dizziness, abdominal pain, skin rash, fevers, chills, night sweats, weight loss, swollen lymph nodes, body aches, joint swelling, muscle aches, chest pain, shortness of breath, mood changes, visual or auditory hallucinations.     Medications: Current Outpatient Medications  Medication Sig Dispense Refill  . valACYclovir (VALTREX) 1000 MG tablet One by mouth every 12 hours for seven days.  Start ASAP within three days of ulcer outbreak. 14 tablet 3  . tamsulosin (FLOMAX) 0.4 MG CAPS capsule Take 1 capsule (0.4 mg total) daily after breakfast by mouth. 30 capsule 1   No current facility-administered medications for this visit.    No Known Allergies  Health Maintenance Health Maintenance  Topic Date Due  . INFLUENZA VACCINE  07/21/2017  . TETANUS/TDAP  09/10/2025  . HIV Screening  Completed     Exam:  BP 133/79   Pulse 86   Ht 6\' 2"  (1.88 m)   Wt 226 lb (102.5 kg)   BMI 29.02 kg/m  Gen: Well NAD HEENT: EOMI,  MMM Lungs: Normal work of breathing. CTABL Heart: RRR no MRG Abd: NABS, Soft. Nondistended, Nontender Exts: Brisk capillary refill, warm and well perfused.  Prostate exam: Normal-appearing anus.  Normal rectal tone.  Prostate is slightly enlarged nontender not boggy with no nodules  Depression screen PHQ 2/9 10/26/2017  Decreased Interest 0  Down, Depressed, Hopeless 0  PHQ - 2 Score 0     No results found for this or any previous visit (from the past 72 hour(s)).  No results found.    Assessment and Plan: 46 y.o. male with  Establish care. Low libido: Unclear etiology.  Plan to check basic fasting labs.  Erectile dysfunction: Discussed options.  Offered sildenafil refill.  He will think about it.  Probable BPH: Based on positive screening AUA form.  Plan to obtain PSA test as well as start Flomax.  Recheck in about a month.    Oral tobacco use: Discussed options.  Plan for nicotine replacement therapy and recheck in about a month   Orders  Placed This Encounter  Procedures  . Flu Vaccine QUAD 36+ mos IM  . CBC  . COMPLETE METABOLIC PANEL WITH GFR  . Lipid Panel w/reflex Direct LDL  . PSA  . Testosterone  . VITAMIN D 25 Hydroxy (Vit-D Deficiency, Fractures)   Meds ordered this encounter  Medications  . valACYclovir (VALTREX) 1000 MG tablet    Sig: One by mouth every 12 hours for seven days.  Start ASAP within three days of ulcer outbreak.    Dispense:  14 tablet    Refill:  3  . tamsulosin (FLOMAX) 0.4 MG CAPS capsule    Sig: Take 1 capsule (0.4 mg total) daily after breakfast by mouth.    Dispense:  30 capsule    Refill:  1     Discussed warning signs or symptoms. Please see discharge instructions. Patient expresses understanding.

## 2017-10-28 DIAGNOSIS — Z23 Encounter for immunization: Secondary | ICD-10-CM | POA: Diagnosis not present

## 2017-10-28 DIAGNOSIS — N529 Male erectile dysfunction, unspecified: Secondary | ICD-10-CM | POA: Diagnosis not present

## 2017-10-28 DIAGNOSIS — F4322 Adjustment disorder with anxiety: Secondary | ICD-10-CM | POA: Diagnosis not present

## 2017-10-28 DIAGNOSIS — Z8042 Family history of malignant neoplasm of prostate: Secondary | ICD-10-CM | POA: Diagnosis not present

## 2017-10-28 DIAGNOSIS — E782 Mixed hyperlipidemia: Secondary | ICD-10-CM | POA: Diagnosis not present

## 2017-10-29 ENCOUNTER — Other Ambulatory Visit: Payer: Self-pay | Admitting: Family Medicine

## 2017-10-29 LAB — CBC
HCT: 47.5 % (ref 38.5–50.0)
Hemoglobin: 16.7 g/dL (ref 13.2–17.1)
MCH: 30.7 pg (ref 27.0–33.0)
MCHC: 35.2 g/dL (ref 32.0–36.0)
MCV: 87.3 fL (ref 80.0–100.0)
MPV: 11.5 fL (ref 7.5–12.5)
PLATELETS: 293 10*3/uL (ref 140–400)
RBC: 5.44 10*6/uL (ref 4.20–5.80)
RDW: 12.3 % (ref 11.0–15.0)
WBC: 9 10*3/uL (ref 3.8–10.8)

## 2017-10-29 LAB — COMPLETE METABOLIC PANEL WITH GFR
AG Ratio: 1.6 (calc) (ref 1.0–2.5)
ALKALINE PHOSPHATASE (APISO): 79 U/L (ref 40–115)
ALT: 20 U/L (ref 9–46)
AST: 17 U/L (ref 10–40)
Albumin: 4.3 g/dL (ref 3.6–5.1)
BILIRUBIN TOTAL: 0.8 mg/dL (ref 0.2–1.2)
BUN: 9 mg/dL (ref 7–25)
CHLORIDE: 105 mmol/L (ref 98–110)
CO2: 27 mmol/L (ref 20–32)
Calcium: 9.7 mg/dL (ref 8.6–10.3)
Creat: 1.13 mg/dL (ref 0.60–1.35)
GFR, EST AFRICAN AMERICAN: 90 mL/min/{1.73_m2} (ref >=60)
GFR, Est Non African American: 78 mL/min/{1.73_m2} (ref >=60)
GLUCOSE: 97 mg/dL (ref 65–99)
Globulin: 2.7 g/dL (calc) (ref 1.9–3.7)
Potassium: 4.2 mmol/L (ref 3.5–5.3)
Sodium: 140 mmol/L (ref 135–146)
TOTAL PROTEIN: 7 g/dL (ref 6.1–8.1)

## 2017-10-29 LAB — LIPID PANEL W/REFLEX DIRECT LDL
Cholesterol: 233 mg/dL — ABNORMAL HIGH (ref <200)
HDL: 40 mg/dL — ABNORMAL LOW (ref >40)
LDL Cholesterol (Calc): 162 mg/dL (calc) — ABNORMAL HIGH
Non-HDL Cholesterol (Calc): 193 mg/dL (calc) — ABNORMAL HIGH (ref <130)
TRIGLYCERIDES: 160 mg/dL — AB (ref <150)
Total CHOL/HDL Ratio: 5.8 (calc) — ABNORMAL HIGH (ref <5.0)

## 2017-10-29 LAB — TESTOSTERONE: Testosterone: 318 ng/dL (ref 250–827)

## 2017-10-29 LAB — PSA: PSA: 1.5 ng/mL (ref <=4.0)

## 2017-10-29 LAB — VITAMIN D 25 HYDROXY (VIT D DEFICIENCY, FRACTURES): Vit D, 25-Hydroxy: 10 ng/mL — ABNORMAL LOW (ref 30–100)

## 2017-10-29 MED ORDER — ATORVASTATIN CALCIUM 20 MG PO TABS: 20.0000 mg | ORAL_TABLET | Freq: Every day | ORAL | 0 refills | Status: DC

## 2017-11-09 DIAGNOSIS — F4322 Adjustment disorder with anxiety: Secondary | ICD-10-CM | POA: Diagnosis not present

## 2017-11-18 DIAGNOSIS — F4322 Adjustment disorder with anxiety: Secondary | ICD-10-CM | POA: Diagnosis not present

## 2017-11-23 ENCOUNTER — Ambulatory Visit: Payer: BLUE CROSS/BLUE SHIELD | Admitting: Family Medicine

## 2017-11-25 DIAGNOSIS — F4322 Adjustment disorder with anxiety: Secondary | ICD-10-CM | POA: Diagnosis not present

## 2017-12-07 ENCOUNTER — Ambulatory Visit (INDEPENDENT_AMBULATORY_CARE_PROVIDER_SITE_OTHER): Payer: BLUE CROSS/BLUE SHIELD | Admitting: Family Medicine

## 2017-12-07 ENCOUNTER — Encounter: Payer: Self-pay | Admitting: Family Medicine

## 2017-12-07 VITALS — BP 125/84 | HR 93 | Ht 74.0 in | Wt 226.0 lb

## 2017-12-07 DIAGNOSIS — N529 Male erectile dysfunction, unspecified: Secondary | ICD-10-CM | POA: Diagnosis not present

## 2017-12-07 DIAGNOSIS — E559 Vitamin D deficiency, unspecified: Secondary | ICD-10-CM | POA: Diagnosis not present

## 2017-12-07 DIAGNOSIS — N401 Enlarged prostate with lower urinary tract symptoms: Secondary | ICD-10-CM

## 2017-12-07 DIAGNOSIS — R350 Frequency of micturition: Secondary | ICD-10-CM

## 2017-12-07 DIAGNOSIS — E782 Mixed hyperlipidemia: Secondary | ICD-10-CM

## 2017-12-07 MED ORDER — TAMSULOSIN HCL 0.4 MG PO CAPS: 0.4000 mg | ORAL_CAPSULE | Freq: Every day | ORAL | 3 refills | Status: DC

## 2017-12-07 NOTE — Progress Notes (Signed)
Chris MatesJason Garza is a 46 y.o. male who [facility] today for hyperlipidemia, BPH, low testosterone.  Chris Garza is doing well currently taking atorvastatin.  He tolerates 20 mg daily with no significant muscle aches or pains.  Pain or jaundice.  BPH: Chris Garza was found to have BPH and started on Flomax.  He notes that his symptoms have significantly improved.  He is satisfied with how well he is doing.  Low testosterone: Chris Garza was found to have low normal testosterone levels on a check about 6 weeks ago.  He does note decreased libido but denies significant erectile dysfunction at this time.   Past Medical History:  Diagnosis Date  . Erectile dysfunction 09/11/2015  . Family history of prostate cancer - Father 09/11/2015   No past surgical history on file. Social History   Tobacco Use  . Smoking status: Never Smoker  . Smokeless tobacco: Current User    Types: Chew  Substance Use Topics  . Alcohol use: Yes    Alcohol/week: 0.6 oz    Types: 1 Standard drinks or equivalent per week   family history includes Cancer in his father; Depression in his unknown relative; Heart attack in his father; Hyperlipidemia in his father; Stroke in his father.  ROS as above:  Medications: Current Outpatient Medications  Medication Sig Dispense Refill  . atorvastatin (LIPITOR) 20 MG tablet Take 1 tablet (20 mg total) daily by mouth. 90 tablet 0  . tamsulosin (FLOMAX) 0.4 MG CAPS capsule Take 1 capsule (0.4 mg total) by mouth daily after breakfast. 90 capsule 3  . valACYclovir (VALTREX) 1000 MG tablet One by mouth every 12 hours for seven days.  Start ASAP within three days of ulcer outbreak. 14 tablet 3   No current facility-administered medications for this visit.    No Known Allergies  Health Maintenance Health Maintenance  Topic Date Due  . TETANUS/TDAP   09/10/2025  . INFLUENZA VACCINE  Completed  . HIV Screening  Completed     Exam:  BP 125/84   Pulse 93   Ht 6\' 2"  (1.88 m)   Wt 226 lb (102.5 kg)   BMI 29.02 kg/m  Gen: Well NAD HEENT: EOMI,  MMM Lungs: Normal work of breathing. CTABL Heart: RRR no MRG Abd: NABS, Soft. Nondistended, Nontender Exts: Brisk capillary refill, warm and well perfused.    Lab Results  Component Value Date   PSA 1.5 10/28/2017   PSA 1.95 09/17/2015      Assessment and Plan: 46 y.o. male with  Hyperlipidemia: We will recheck fasting labs.  We will represcribed Lipitor based on lab results.  BPH: Doing much better Flomax.  PSA value normal and not trending up.  Continue to follow clinically.  Recheck PSA in 1 year.  Low libido: We will recheck testosterone.  If testosterone is normal consider sex therapy.  Also recheck vitamin D   Orders Placed This Encounter  Procedures  . CBC  . COMPLETE METABOLIC PANEL WITH GFR  . Lipid Panel w/reflex Direct LDL  . Testosterone  . VITAMIN D 25 Hydroxy (Vit-D Deficiency, Fractures)   Meds ordered this encounter  Medications  . tamsulosin (FLOMAX) 0.4 MG CAPS capsule    Sig: Take 1 capsule (0.4 mg total) by mouth daily after breakfast.    Dispense:  90 capsule    Refill:  3     Discussed warning signs or symptoms. Please see discharge instructions. Patient expresses  understanding.

## 2017-12-07 NOTE — Patient Instructions (Signed)
Thank you for coming in today. Continue flomax.  Get fasting labs in the morning in the near future.  I will get results to you typically the next day.  I will send in the prescription for Lipitor based on lab results.   We will recheck testosterone as well.

## 2017-12-10 DIAGNOSIS — E559 Vitamin D deficiency, unspecified: Secondary | ICD-10-CM | POA: Diagnosis not present

## 2017-12-10 DIAGNOSIS — N529 Male erectile dysfunction, unspecified: Secondary | ICD-10-CM | POA: Diagnosis not present

## 2017-12-10 DIAGNOSIS — R350 Frequency of micturition: Secondary | ICD-10-CM | POA: Diagnosis not present

## 2017-12-10 DIAGNOSIS — E782 Mixed hyperlipidemia: Secondary | ICD-10-CM | POA: Diagnosis not present

## 2017-12-11 LAB — COMPLETE METABOLIC PANEL WITH GFR
AG Ratio: 1.7 (calc) (ref 1.0–2.5)
ALBUMIN MSPROF: 4.2 g/dL (ref 3.6–5.1)
ALT: 23 U/L (ref 9–46)
AST: 21 U/L (ref 10–40)
Alkaline phosphatase (APISO): 71 U/L (ref 40–115)
BUN: 11 mg/dL (ref 7–25)
CALCIUM: 9.1 mg/dL (ref 8.6–10.3)
CO2: 27 mmol/L (ref 20–32)
CREATININE: 1.14 mg/dL (ref 0.60–1.35)
Chloride: 102 mmol/L (ref 98–110)
GFR, EST NON AFRICAN AMERICAN: 77 mL/min/{1.73_m2} (ref >=60)
GFR, Est African American: 90 mL/min/{1.73_m2} (ref >=60)
GLOBULIN: 2.5 g/dL (ref 1.9–3.7)
Glucose, Bld: 89 mg/dL (ref 65–99)
Potassium: 3.7 mmol/L (ref 3.5–5.3)
SODIUM: 138 mmol/L (ref 135–146)
Total Bilirubin: 1.2 mg/dL (ref 0.2–1.2)
Total Protein: 6.7 g/dL (ref 6.1–8.1)

## 2017-12-11 LAB — CBC
HEMATOCRIT: 45.6 % (ref 38.5–50.0)
HEMOGLOBIN: 15.9 g/dL (ref 13.2–17.1)
MCH: 30.5 pg (ref 27.0–33.0)
MCHC: 34.9 g/dL (ref 32.0–36.0)
MCV: 87.5 fL (ref 80.0–100.0)
MPV: 11.4 fL (ref 7.5–12.5)
Platelets: 288 10*3/uL (ref 140–400)
RBC: 5.21 10*6/uL (ref 4.20–5.80)
RDW: 12.3 % (ref 11.0–15.0)
WBC: 7.2 10*3/uL (ref 3.8–10.8)

## 2017-12-11 LAB — TESTOSTERONE: TESTOSTERONE: 324 ng/dL (ref 250–827)

## 2017-12-11 LAB — LIPID PANEL W/REFLEX DIRECT LDL
CHOL/HDL RATIO: 3.9 (calc) (ref <5.0)
Cholesterol: 149 mg/dL (ref <200)
HDL: 38 mg/dL — ABNORMAL LOW (ref >40)
LDL Cholesterol (Calc): 90 mg/dL (calc)
NON-HDL CHOLESTEROL (CALC): 111 mg/dL (ref <130)
Triglycerides: 109 mg/dL (ref <150)

## 2017-12-11 LAB — VITAMIN D 25 HYDROXY (VIT D DEFICIENCY, FRACTURES): Vit D, 25-Hydroxy: 30 ng/mL (ref 30–100)

## 2017-12-16 ENCOUNTER — Other Ambulatory Visit: Payer: Self-pay | Admitting: Family Medicine

## 2017-12-16 MED ORDER — ATORVASTATIN CALCIUM 20 MG PO TABS: 20.0000 mg | ORAL_TABLET | Freq: Every day | ORAL | 3 refills | Status: DC

## 2017-12-30 DIAGNOSIS — F4322 Adjustment disorder with anxiety: Secondary | ICD-10-CM | POA: Diagnosis not present

## 2018-02-22 ENCOUNTER — Telehealth: Payer: Self-pay | Admitting: Family Medicine

## 2018-02-22 MED ORDER — OSELTAMIVIR PHOSPHATE 75 MG PO CAPS: 75.0000 mg | ORAL_CAPSULE | Freq: Every day | ORAL | 0 refills | Status: DC

## 2018-02-22 NOTE — Telephone Encounter (Signed)
Left VM updating Pt of Rx.

## 2018-02-22 NOTE — Telephone Encounter (Signed)
Tamiflu ppx sent to pharmacy 

## 2019-01-24 ENCOUNTER — Other Ambulatory Visit: Payer: Self-pay | Admitting: Family Medicine

## 2020-12-10 ENCOUNTER — Encounter: Payer: BLUE CROSS/BLUE SHIELD | Admitting: Sports Medicine

## 2023-09-09 ENCOUNTER — Ambulatory Visit: Payer: 59 | Admitting: Family Medicine

## 2023-09-09 ENCOUNTER — Encounter: Payer: Self-pay | Admitting: Family Medicine

## 2023-09-09 VITALS — BP 116/79 | HR 77 | Ht 73.0 in | Wt 203.8 lb

## 2023-09-09 DIAGNOSIS — Z8042 Family history of malignant neoplasm of prostate: Secondary | ICD-10-CM | POA: Diagnosis not present

## 2023-09-09 DIAGNOSIS — N529 Male erectile dysfunction, unspecified: Secondary | ICD-10-CM

## 2023-09-09 DIAGNOSIS — L989 Disorder of the skin and subcutaneous tissue, unspecified: Secondary | ICD-10-CM | POA: Insufficient documentation

## 2023-09-09 DIAGNOSIS — E782 Mixed hyperlipidemia: Secondary | ICD-10-CM | POA: Diagnosis not present

## 2023-09-09 DIAGNOSIS — N401 Enlarged prostate with lower urinary tract symptoms: Secondary | ICD-10-CM

## 2023-09-09 DIAGNOSIS — R350 Frequency of micturition: Secondary | ICD-10-CM

## 2023-09-09 DIAGNOSIS — Z1159 Encounter for screening for other viral diseases: Secondary | ICD-10-CM

## 2023-09-09 DIAGNOSIS — B009 Herpesviral infection, unspecified: Secondary | ICD-10-CM

## 2023-09-09 MED ORDER — ATORVASTATIN CALCIUM 20 MG PO TABS: 20.0000 mg | ORAL_TABLET | Freq: Every day | ORAL | 3 refills | Status: AC

## 2023-09-09 MED ORDER — VALACYCLOVIR HCL 1 G PO TABS: ORAL_TABLET | ORAL | 0 refills | Status: DC

## 2023-09-09 MED ORDER — TAMSULOSIN HCL 0.4 MG PO CAPS: 0.4000 mg | ORAL_CAPSULE | Freq: Every day | ORAL | 2 refills | Status: DC

## 2023-09-09 NOTE — Assessment & Plan Note (Addendum)
Lesion near corner of left eye that has been increasing; can now see it in his periphery - looks fluid filled -ref to derm

## 2023-09-09 NOTE — Progress Notes (Signed)
New patient visit   Patient: Chris Garza   DOB: 1971-02-10   52 y.o. Male  MRN: [id number] Visit Date: 09/09/2023  Today's healthcare provider: Charlton Amor, DO   Chief Complaint  Patient presents with   New Patient (Initial Visit)    Establish care    SUBJECTIVE    Chief Complaint  Patient presents with   New Patient (Initial Visit)    Establish care   HPI HPI     New Patient (Initial Visit)    Additional comments: Establish care      Last edited by Roselyn Reef, CMA on 09/09/2023 11:15 AM.      Pt presents to establish care. 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   Hx hyperlipidemia - was previously on atorvastain 20mg  but ran out  - diet and exercise have decreased due to being caregiver and working   Hx bph - was on flomax, needs refills   Needs refill on valtrex  Has concerns of low testosterone - admits to low energy, decreased libido - dad has hx of low testosterone   Review of Systems  Constitutional:  Negative for activity change, fatigue and fever.  Respiratory:  Negative for cough and shortness of breath.   Cardiovascular:  Negative for chest pain.  Gastrointestinal:  Negative for abdominal pain.  Genitourinary:  Negative for difficulty urinating.       No outpatient medications have been marked as taking for the 09/09/23 encounter (Office Visit) with Charlton Amor, DO.    OBJECTIVE    BP 116/79 (BP Location: Right Arm, Patient Position: Sitting, Cuff Size: Large)   Pulse 77   Ht 6\' 1"  (1.854 m)   Wt 203 lb 12 oz (92.4 kg)   SpO2 95%   BMI 26.88 kg/m   Physical Exam Vitals and nursing note reviewed.  Constitutional:      General: He is not in acute distress.    Appearance: Normal appearance.  HENT:     Head: Normocephalic and atraumatic.     Right Ear: External ear normal.     Left Ear: External ear normal.     Nose: Nose  normal.  Eyes:     Conjunctiva/sclera: Conjunctivae normal.  Cardiovascular:     Rate and Rhythm: Normal rate and regular rhythm.  Pulmonary:     Effort: Pulmonary effort is normal.     Breath sounds: Normal breath sounds.  Skin:    Comments: Lesion near corner of left eye   Neurological:     General: No focal deficit present.     Mental Status: He is alert and oriented to person, place, and time.  Psychiatric:        Mood and Affect: Mood normal.        Behavior: Behavior normal.        Thought Content: Thought content normal.        Judgment: Judgment normal.        ASSESSMENT & PLAN    Problem List Items Addressed This Visit       Musculoskeletal and Integument   Skin lesion    Lesion near corner of left eye that has been increasing; can now see it in his periphery - looks fluid filled -ref to derm      Relevant Orders   Ambulatory referral to Dermatology     Genitourinary  BPH (benign prostatic hyperplasia)   Relevant Medications   tamsulosin (FLOMAX) 0.4 MG CAPS capsule   Other Relevant Orders   PSA     Other   Erectile dysfunction (Chronic)    - testosterone level ordered and discussed needing draw before 10am      Relevant Orders   Testosterone   Hyperlipidemia (Chronic)    Lipid panel ordered - refilled atorvastatin      Relevant Medications   atorvastatin (LIPITOR) 20 MG tablet   Other Relevant Orders   Lipid panel   Family history of prostate cancer - Father - Primary   Relevant Orders   PSA   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      Meds ordered this encounter  Medications   atorvastatin (LIPITOR) 20 MG tablet    Sig: Take 1 tablet (20 mg total) by mouth daily.    Dispense:  90 tablet    Refill:  3   valACYclovir (VALTREX) 1000 MG tablet    Sig: One by mouth every 12  hours for seven days.  Start ASAP within three days of ulcer outbreak.One by mouth every 12 hours for seven days.  Start ASAP within three days of ulcer outbreak.    Dispense:  30 tablet    Refill:  0   tamsulosin (FLOMAX) 0.4 MG CAPS capsule    Sig: Take 1 capsule (0.4 mg total) by mouth daily after breakfast.    Dispense:  90 capsule    Refill:  2    Orders Placed This Encounter  Procedures   Lipid panel    Order Specific Question:   Has the patient fasted?    Answer:   No    Order Specific Question:   Release to patient    Answer:   Immediate   Testosterone   PSA   Ambulatory referral to Dermatology    Referral Priority:   Routine    Referral Type:   Consultation    Referral Reason:   Specialty Services Required    Requested Specialty:   Dermatology    Number of Visits Requested:   1     Charlton Amor, DO  [facility] at [facility] [phone] (phone) [phone] (fax)  [facility]

## 2023-09-09 NOTE — Assessment & Plan Note (Signed)
Refill valtrex

## 2023-09-09 NOTE — Assessment & Plan Note (Signed)
Lipid panel ordered - refilled atorvastatin

## 2023-09-09 NOTE — Patient Instructions (Signed)
Great meeting you today   We will discuss follow up based on bloodwork   Make sure blood is done before 10am for testosterone purposes

## 2023-09-09 NOTE — Assessment & Plan Note (Addendum)
-   testosterone level ordered and discussed needing draw before 10am

## 2023-09-13 ENCOUNTER — Encounter: Payer: Self-pay | Admitting: Family Medicine

## 2023-09-18 ENCOUNTER — Other Ambulatory Visit: Payer: Self-pay | Admitting: Family Medicine

## 2023-09-18 DIAGNOSIS — B009 Herpesviral infection, unspecified: Secondary | ICD-10-CM

## 2023-11-15 ENCOUNTER — Telehealth: Payer: Self-pay

## 2023-11-24 NOTE — Telephone Encounter (Signed)
Appt held 09/09/23 with Tamera Punt

## 2023-11-25 ENCOUNTER — Ambulatory Visit: Payer: 59 | Admitting: Family Medicine

## 2023-11-25 ENCOUNTER — Encounter: Payer: Self-pay | Admitting: Family Medicine

## 2023-11-25 VITALS — BP 126/79 | HR 85 | Ht 73.0 in | Wt 199.8 lb

## 2023-11-25 DIAGNOSIS — K4090 Unilateral inguinal hernia, without obstruction or gangrene, not specified as recurrent: Secondary | ICD-10-CM | POA: Diagnosis not present

## 2023-11-25 DIAGNOSIS — E782 Mixed hyperlipidemia: Secondary | ICD-10-CM

## 2023-11-25 DIAGNOSIS — N529 Male erectile dysfunction, unspecified: Secondary | ICD-10-CM

## 2023-11-25 DIAGNOSIS — N401 Enlarged prostate with lower urinary tract symptoms: Secondary | ICD-10-CM

## 2023-11-25 DIAGNOSIS — M545 Low back pain, unspecified: Secondary | ICD-10-CM | POA: Insufficient documentation

## 2023-11-25 DIAGNOSIS — Z79899 Other long term (current) drug therapy: Secondary | ICD-10-CM

## 2023-11-25 DIAGNOSIS — Z8042 Family history of malignant neoplasm of prostate: Secondary | ICD-10-CM

## 2023-11-25 MED ORDER — METHOCARBAMOL 500 MG PO TABS: 500.0000 mg | ORAL_TABLET | Freq: Two times a day (BID) | ORAL | 0 refills | Status: AC | PRN

## 2023-11-25 MED ORDER — MELOXICAM 15 MG PO TABS: 15.0000 mg | ORAL_TABLET | Freq: Every day | ORAL | 0 refills | Status: AC

## 2023-11-25 NOTE — Assessment & Plan Note (Addendum)
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

## 2023-11-25 NOTE — Assessment & Plan Note (Signed)
Pt will get blood work from last time today

## 2023-11-25 NOTE — Progress Notes (Signed)
Established patient visit   Patient: Chris Garza   DOB: 1971/09/07   52 y.o. Male  MRN: [id number] Visit Date: 11/25/2023  Today's healthcare provider: Charlton Amor, DO   Chief Complaint  Patient presents with   Back Pain    Lower left back and what feels like a hernia according to the patient    SUBJECTIVE    Chief Complaint  Patient presents with   Back Pain    Lower left back and what feels like a hernia according to the patient   HPI HPI     Back Pain    Additional comments: Lower left back and what feels like a hernia according to the patient      Last edited by Roselyn Reef, CMA on 11/25/2023  8:18 AM.       Pt presents for concerns of low back pain and a hernia. He was working to clear out space last week and overworked himself for 12+ hours. Shortly after that he started to notice back pain. He admits to a "grinding feeling" in his back. He was able to raise his bed and get relief at night and says the pain has improved slightly since then. Denies any radicular symptoms.   Pt also presents with concerns of left hernia that has gotten progressively bigger over the last few weeks. He is able to reduce it.   Review of Systems  Constitutional:  Negative for activity change, fatigue and fever.  Respiratory:  Negative for cough and shortness of breath.   Cardiovascular:  Negative for chest pain.  Gastrointestinal:  Negative for abdominal pain.  Genitourinary:  Negative for difficulty urinating.       Left hernia  Musculoskeletal:  Positive for back pain.       Current Meds  Medication Sig   atorvastatin (LIPITOR) 20 MG tablet Take 1 tablet (20 mg total) by mouth daily.   meloxicam (MOBIC) 15 MG tablet Take 1 tablet (15 mg total) by mouth daily.   methocarbamol (ROBAXIN) 500 MG tablet Take 1 tablet (500 mg total) by mouth 2 (two) times daily as needed for muscle spasms.   tamsulosin (FLOMAX) 0.4 MG CAPS capsule Take 1 capsule (0.4 mg total) by  mouth daily after breakfast.   valACYclovir (VALTREX) 1000 MG tablet One by mouth every 12 hours for seven days.  Start ASAP within three days of ulcer outbreak.One by mouth every 12 hours for seven days.  Start ASAP within three days of ulcer outbreak.    OBJECTIVE    BP 126/79 (BP Location: Left Arm, Patient Position: Sitting, Cuff Size: Large)   Pulse 85   Ht 6\' 1"  (1.854 m)   Wt 199 lb 12 oz (90.6 kg)   SpO2 98%   BMI 26.35 kg/m   Physical Exam Vitals and nursing note reviewed.  Constitutional:      General: He is not in acute distress.    Appearance: Normal appearance.  HENT:     Head: Normocephalic and atraumatic.     Right Ear: External ear normal.     Left Ear: External ear normal.     Nose: Nose normal.  Eyes:     Conjunctiva/sclera: Conjunctivae normal.  Cardiovascular:     Rate and Rhythm: Normal rate.  Pulmonary:     Effort: Pulmonary effort is normal.  Genitourinary:    Comments: Chaperone present on exam. Left inguinal hernia, that is reducible Neurological:     General: No focal deficit present.  Mental Status: He is alert and oriented to person, place, and time.  Psychiatric:        Mood and Affect: Mood normal.        Behavior: Behavior normal.        Thought Content: Thought content normal.        Judgment: Judgment normal.        ASSESSMENT & PLAN    Problem List Items Addressed This Visit       Genitourinary   BPH (benign prostatic hyperplasia)   Relevant Orders   PSA     Other   Erectile dysfunction (Chronic)   Relevant Orders   Testosterone   Hyperlipidemia (Chronic)    Pt will get blood work from last time today      Relevant Orders   Lipid panel   Family history of prostate cancer - Father   Relevant Orders   PSA   Acute left-sided low back pain without sciatica    Much improved since last week however we did discuss giving him low back exercises as well as medical therapy with robaxin and meloxicam - denies radiculopathy  or red flag symptoms      Relevant Medications   meloxicam (MOBIC) 15 MG tablet   methocarbamol (ROBAXIN) 500 MG tablet   Left inguinal hernia - Primary    Left inguinal hernia without strangulation but pretty significant outpouching of wall  - referral to gen surgery for repair  - did discuss red flag precautions such as strangulation      Relevant Orders   Ambulatory referral to General Surgery   Other Visit Diagnoses     Medication management       Relevant Orders   CMP14+EGFR       No follow-ups on file.      Meds ordered this encounter  Medications   meloxicam (MOBIC) 15 MG tablet    Sig: Take 1 tablet (15 mg total) by mouth daily.    Dispense:  30 tablet    Refill:  0   methocarbamol (ROBAXIN) 500 MG tablet    Sig: Take 1 tablet (500 mg total) by mouth 2 (two) times daily as needed for muscle spasms.    Dispense:  30 tablet    Refill:  0    Orders Placed This Encounter  Procedures   CMP14+EGFR    Order Specific Question:   Has the patient fasted?    Answer:   No   PSA   Testosterone   Lipid panel    Order Specific Question:   Has the patient fasted?    Answer:   No    Order Specific Question:   Release to patient    Answer:   Immediate   Ambulatory referral to General Surgery    Referral Priority:   Urgent    Referral Type:   Surgical    Referral Reason:   Specialty Services Required    Referred to Provider:   [facility], [place]    Requested Specialty:   General Surgery    Number of Visits Requested:   1     Charlton Amor, DO  [facility] at [facility] [phone] (phone) [phone] (fax)  [facility]

## 2023-11-25 NOTE — Assessment & Plan Note (Addendum)
Much improved since last week however we did discuss giving him low back exercises as well as medical therapy with robaxin and meloxicam - denies radiculopathy or red flag symptoms

## 2023-11-26 LAB — CMP14+EGFR
ALT: 15 [IU]/L (ref 0–44)
AST: 17 [IU]/L (ref 0–40)
Albumin: 4.4 g/dL (ref 3.8–4.9)
Alkaline Phosphatase: 102 [IU]/L (ref 44–121)
BUN/Creatinine Ratio: 11 (ref 9–20)
BUN: 14 mg/dL (ref 6–24)
Bilirubin Total: 0.6 mg/dL (ref 0.0–1.2)
CO2: 24 mmol/L (ref 20–29)
Calcium: 9.3 mg/dL (ref 8.7–10.2)
Chloride: 105 mmol/L (ref 96–106)
Creatinine, Ser: 1.23 mg/dL (ref 0.76–1.27)
Globulin, Total: 2.5 g/dL (ref 1.5–4.5)
Glucose: 100 mg/dL — ABNORMAL HIGH (ref 70–99)
Potassium: 4 mmol/L (ref 3.5–5.2)
Sodium: 142 mmol/L (ref 134–144)
Total Protein: 6.9 g/dL (ref 6.0–8.5)
eGFR: 71 mL/min/{1.73_m2} (ref >59)

## 2023-11-26 LAB — PSA: Prostate Specific Ag, Serum: 3 ng/mL (ref 0.0–4.0)

## 2023-11-26 LAB — TESTOSTERONE: Testosterone: 448 ng/dL (ref 264–916)

## 2023-11-26 LAB — LIPID PANEL
Chol/HDL Ratio: 3.5 {ratio} (ref 0.0–5.0)
Cholesterol, Total: 151 mg/dL (ref 100–199)
HDL: 43 mg/dL (ref >39)
LDL Chol Calc (NIH): 86 mg/dL (ref 0–99)
Triglycerides: 125 mg/dL (ref 0–149)
VLDL Cholesterol Cal: 22 mg/dL (ref 5–40)

## 2024-04-27 ENCOUNTER — Encounter: Payer: Self-pay | Admitting: Family Medicine

## 2024-05-29 ENCOUNTER — Other Ambulatory Visit: Payer: Self-pay | Admitting: Family Medicine

## 2024-05-29 DIAGNOSIS — N401 Enlarged prostate with lower urinary tract symptoms: Secondary | ICD-10-CM
